# Patient Record
Sex: Female | Born: 1970 | Race: White | Hispanic: No | Marital: Single | Smoking: Never smoker
Health system: Southern US, Community
[De-identification: ages and names within clinical notes are randomized; demographics above are authoritative.]

## PROBLEM LIST (undated history)

## (undated) DIAGNOSIS — G43909 Migraine, unspecified, not intractable, without status migrainosus: Secondary | ICD-10-CM

---

## 1998-09-29 ENCOUNTER — Other Ambulatory Visit: Admission: RE | Admit: 1998-09-29 | Discharge: 1998-09-29 | Payer: Self-pay | Admitting: Gynecology

## 1999-10-02 ENCOUNTER — Other Ambulatory Visit: Admission: RE | Admit: 1999-10-02 | Discharge: 1999-10-02 | Payer: Self-pay | Admitting: Gynecology

## 2000-06-18 ENCOUNTER — Inpatient Hospital Stay (HOSPITAL_COMMUNITY): Admission: AD | Admit: 2000-06-18 | Discharge: 2000-06-21 | Payer: Self-pay | Admitting: Obstetrics & Gynecology

## 2000-08-01 ENCOUNTER — Other Ambulatory Visit: Admission: RE | Admit: 2000-08-01 | Discharge: 2000-08-01 | Payer: Self-pay | Admitting: Obstetrics & Gynecology

## 2001-09-04 ENCOUNTER — Other Ambulatory Visit: Admission: RE | Admit: 2001-09-04 | Discharge: 2001-09-04 | Payer: Self-pay | Admitting: Obstetrics & Gynecology

## 2002-10-05 ENCOUNTER — Other Ambulatory Visit: Admission: RE | Admit: 2002-10-05 | Discharge: 2002-10-05 | Payer: Self-pay | Admitting: Obstetrics & Gynecology

## 2003-10-19 ENCOUNTER — Other Ambulatory Visit: Admission: RE | Admit: 2003-10-19 | Discharge: 2003-10-19 | Payer: Self-pay | Admitting: Obstetrics & Gynecology

## 2003-11-09 ENCOUNTER — Emergency Department (HOSPITAL_COMMUNITY): Admission: AD | Admit: 2003-11-09 | Discharge: 2003-11-09 | Payer: Self-pay | Admitting: Family Medicine

## 2004-11-28 ENCOUNTER — Other Ambulatory Visit: Admission: RE | Admit: 2004-11-28 | Discharge: 2004-11-28 | Payer: Self-pay | Admitting: Obstetrics & Gynecology

## 2005-06-05 ENCOUNTER — Other Ambulatory Visit: Admission: RE | Admit: 2005-06-05 | Discharge: 2005-06-05 | Payer: Self-pay | Admitting: Obstetrics & Gynecology

## 2005-11-13 ENCOUNTER — Other Ambulatory Visit: Admission: RE | Admit: 2005-11-13 | Discharge: 2005-11-13 | Payer: Self-pay | Admitting: Obstetrics & Gynecology

## 2006-02-08 ENCOUNTER — Encounter: Admission: RE | Admit: 2006-02-08 | Discharge: 2006-02-08 | Payer: Self-pay | Admitting: Specialist

## 2012-05-06 ENCOUNTER — Encounter (HOSPITAL_COMMUNITY): Payer: Self-pay | Admitting: *Deleted

## 2012-05-06 ENCOUNTER — Emergency Department (HOSPITAL_COMMUNITY)
Admission: EM | Admit: 2012-05-06 | Discharge: 2012-05-07 | Disposition: A | Discharge status: Home / Self Care | Payer: BC Managed Care – PPO | Attending: Emergency Medicine | Admitting: Emergency Medicine

## 2012-05-06 DIAGNOSIS — B349 Viral infection, unspecified: Secondary | ICD-10-CM

## 2012-05-06 DIAGNOSIS — B9789 Other viral agents as the cause of diseases classified elsewhere: Secondary | ICD-10-CM | POA: Insufficient documentation

## 2012-05-06 DIAGNOSIS — G43909 Migraine, unspecified, not intractable, without status migrainosus: Secondary | ICD-10-CM | POA: Insufficient documentation

## 2012-05-06 DIAGNOSIS — R197 Diarrhea, unspecified: Secondary | ICD-10-CM | POA: Insufficient documentation

## 2012-05-06 DIAGNOSIS — R110 Nausea: Secondary | ICD-10-CM | POA: Insufficient documentation

## 2012-05-06 DIAGNOSIS — I10 Essential (primary) hypertension: Secondary | ICD-10-CM | POA: Insufficient documentation

## 2012-05-06 HISTORY — DX: Migraine, unspecified, not intractable, without status migrainosus: G43.909

## 2012-05-06 NOTE — ED Notes (Addendum)
C/o migraine HA, onset ~ 6 hrs ago, has tried advil migraine 1430, tylenol migraine 1730 & excedrin migraine 2030 w/o relief. Also mentions cough, congestion and sore throat onset Sunday (sore throat resolved). Alert, moaning and sighing, interactive, skin W&D, resps e/u, speaking in clear complete sentences.  (denies: nv, fever). Also mentions diarrhea onset yesterday. Last BM 3 hrs ago. (BM loose, not watery). Also took xanax at 2030. Mentions periods were initial cause of migraines, also reports lots of stress with family issues (son).

## 2012-05-07 MED ORDER — KETOROLAC TROMETHAMINE 30 MG/ML IJ SOLN
30.0000 mg | Freq: Once | INTRAMUSCULAR | Status: AC
  Administered 2012-05-07: 30 mg via INTRAVENOUS
  Filled 2012-05-07: qty 1

## 2012-05-07 MED ORDER — METOCLOPRAMIDE HCL 5 MG/ML IJ SOLN
10.0000 mg | Freq: Once | INTRAMUSCULAR | Status: AC
  Administered 2012-05-07: 10 mg via INTRAVENOUS
  Filled 2012-05-07: qty 2

## 2012-05-07 MED ORDER — DIPHENHYDRAMINE HCL 50 MG/ML IJ SOLN
12.5000 mg | Freq: Once | INTRAMUSCULAR | Status: AC
  Administered 2012-05-07: 12.5 mg via INTRAVENOUS
  Filled 2012-05-07: qty 1

## 2012-05-07 MED ORDER — SODIUM CHLORIDE 0.9 % IV SOLN
Freq: Once | INTRAVENOUS | Status: AC
  Administered 2012-05-07: 02:00:00 via INTRAVENOUS

## 2012-05-07 MED ORDER — DEXAMETHASONE SODIUM PHOSPHATE 10 MG/ML IJ SOLN
10.0000 mg | Freq: Once | INTRAMUSCULAR | Status: AC
  Administered 2012-05-07: 10 mg via INTRAVENOUS
  Filled 2012-05-07: qty 1

## 2012-05-07 NOTE — ED Notes (Signed)
Vitals at 0318 charted under wrong time. Vitals done at 0215.

## 2012-05-07 NOTE — Discharge Instructions (Signed)
Try to eat lightly, easily digested foods for the next several days

## 2012-05-07 NOTE — ED Provider Notes (Signed)
Medical screening examination/treatment/procedure(s) were performed by non-physician practitioner and as supervising physician I was immediately available for consultation/collaboration.    Zianne Schubring D Nadim Malia, MD 05/07/12 0700 

## 2012-05-07 NOTE — ED Provider Notes (Signed)
History     CSN: [id number]  Arrival date & time 05/06/12  2314   None     Chief Complaint  Patient presents with  . Migraine  . Hypertension    (Consider location/radiation/quality/duration/timing/severity/associated sxs/prior treatment) HPI Comments: Patient with history of migraine headaches, usually can take care of at home with over-the-counter medications.  This has been complicated by a viral syndrome, consisting of nausea, and diarrhea that started on Saturday  Patient is a 41 y.o. female presenting with migraine and hypertension. The history is provided by the patient.  Migraine This is a recurrent problem. The current episode started yesterday. The problem occurs constantly. The problem has been gradually worsening. Associated symptoms include headaches and nausea. Pertinent negatives include no fever or vomiting.  Hypertension Associated symptoms include headaches and nausea. Pertinent negatives include no fever or vomiting.    Past Medical History  Diagnosis Date  . Migraines     Past Surgical History  Procedure Date  . Cesarean section     No family history on file.  History  Substance Use Topics  . Smoking status: Never Smoker   . Smokeless tobacco: Not on file  . Alcohol Use: No    OB History    Grav Para Term Preterm Abortions TAB SAB Ect Mult Living                  Review of Systems  Constitutional: Negative for fever.  Gastrointestinal: Positive for nausea and diarrhea. Negative for vomiting.  Neurological: Positive for headaches. Negative for dizziness.    Allergies  Penicillins  Home Medications   Current Outpatient Rx  Name Route Sig Dispense Refill  . ACETAMINOPHEN 500 MG PO TABS Oral Take 1,000 mg by mouth every 6 (six) hours as needed. For headaches    . ALPRAZOLAM 0.5 MG PO TABS Oral Take 0.5 mg by mouth 3 (three) times daily as needed. For sleep    . ASPIRIN-ACETAMINOPHEN-CAFFEINE 250-250-65 MG PO TABS Oral Take 2 tablets by  mouth every 6 (six) hours as needed. For headaches    . IBUPROFEN 200 MG PO CAPS Oral Take 400 mg by mouth 3 (three) times daily as needed. For migraine      BP 182/95  Pulse 83  Temp(Src) 98 F (36.7 C) (Oral)  Resp 20  SpO2 98%  Physical Exam  Constitutional: She is oriented to person, place, and time. She appears well-developed and well-nourished.  HENT:  Head: Normocephalic.  Eyes: Pupils are equal, round, and reactive to light.  Neck: Normal range of motion.  Cardiovascular: Normal rate.   Pulmonary/Chest: Effort normal.  Musculoskeletal: Normal range of motion.  Neurological: She is alert and oriented to person, place, and time. No cranial nerve deficit.  Skin: Skin is warm.    ED Course  Procedures (including critical care time)  Labs Reviewed - No data to display No results found.   1. Migraine headache   2. Viral syndrome     Patient states she is feeling much better.  Her headache pain.  Now is 3/10, and she feels that she comments above.  The rest of her headache  MDM   Will hydrate, and use the abscess.  Migraine.  Cocktail, consisting of Toradol Benadryl, Reglan, and dexamethasone        Arman Filter, NP 05/07/12 [id number]  Arman Filter, NP 05/07/12 1191  Arman Filter, NP 05/07/12 [phone]

## 2013-02-27 ENCOUNTER — Ambulatory Visit: Payer: BC Managed Care – PPO | Admitting: Family Medicine

## 2015-04-28 ENCOUNTER — Other Ambulatory Visit: Payer: Self-pay | Admitting: Obstetrics and Gynecology

## 2015-04-28 DIAGNOSIS — R4702 Dysphasia: Secondary | ICD-10-CM

## 2015-05-03 ENCOUNTER — Ambulatory Visit
Admission: RE | Admit: 2015-05-03 | Discharge: 2015-05-03 | Disposition: A | Discharge status: Home / Self Care | Payer: Self-pay | Source: Ambulatory Visit | Attending: Obstetrics and Gynecology | Admitting: Obstetrics and Gynecology

## 2015-05-03 DIAGNOSIS — R4702 Dysphasia: Secondary | ICD-10-CM

## 2015-05-05 ENCOUNTER — Other Ambulatory Visit: Payer: Self-pay | Admitting: Orthopedic Surgery

## 2015-05-05 DIAGNOSIS — M5416 Radiculopathy, lumbar region: Secondary | ICD-10-CM

## 2015-05-06 ENCOUNTER — Ambulatory Visit
Admission: RE | Admit: 2015-05-06 | Discharge: 2015-05-06 | Disposition: A | Discharge status: Home / Self Care | Payer: BLUE CROSS/BLUE SHIELD | Source: Ambulatory Visit | Attending: Orthopedic Surgery | Admitting: Orthopedic Surgery

## 2015-05-06 DIAGNOSIS — M5416 Radiculopathy, lumbar region: Secondary | ICD-10-CM

## 2016-03-30 ENCOUNTER — Other Ambulatory Visit: Payer: Self-pay | Admitting: Obstetrics & Gynecology

## 2016-03-30 DIAGNOSIS — R928 Other abnormal and inconclusive findings on diagnostic imaging of breast: Secondary | ICD-10-CM

## 2016-04-06 ENCOUNTER — Ambulatory Visit
Admission: RE | Admit: 2016-04-06 | Discharge: 2016-04-06 | Disposition: A | Discharge status: Home / Self Care | Payer: BLUE CROSS/BLUE SHIELD | Source: Ambulatory Visit | Attending: Obstetrics & Gynecology | Admitting: Obstetrics & Gynecology

## 2016-04-06 DIAGNOSIS — R928 Other abnormal and inconclusive findings on diagnostic imaging of breast: Secondary | ICD-10-CM

## 2016-11-01 IMAGING — MR MR LUMBAR SPINE W/O CM
5 series · 48 of 48 positions shown · non-contrast
Comparison: None.

CLINICAL DATA: Low back pain.  Left leg radiculopathy

EXAM:
MRI LUMBAR SPINE WITHOUT CONTRAST
TECHNIQUE: Multiplanar, multisequence MR imaging of the lumbar spine was
performed. No intravenous contrast was administered.

[Series 3: T2 · sagittal · 4.0mm · 0.88mm/px · 5 of 13 slices shown (1 of 2)]
[im 1/13]
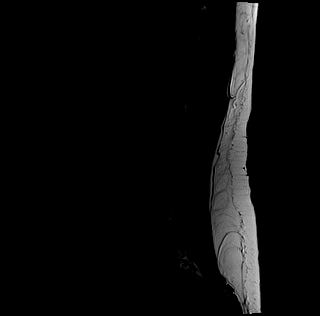
[im 4/13]
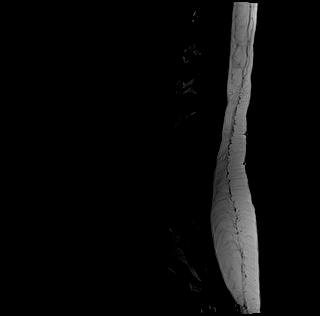
[im 7/13]
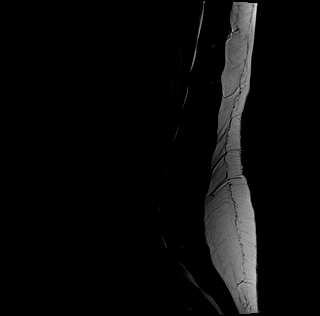
[im 10/13]
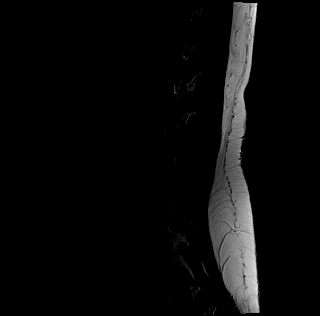
[im 13/13]
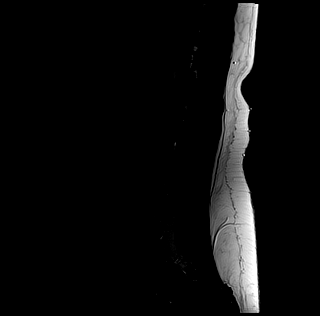

[Series 4: tirm sag · sagittal · 4.0mm · 0.55mm/px · 5 of 13 slices shown]
[im 1/13]
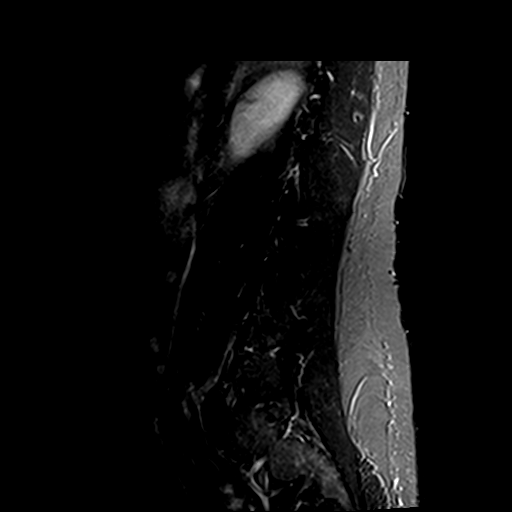
[im 4/13]
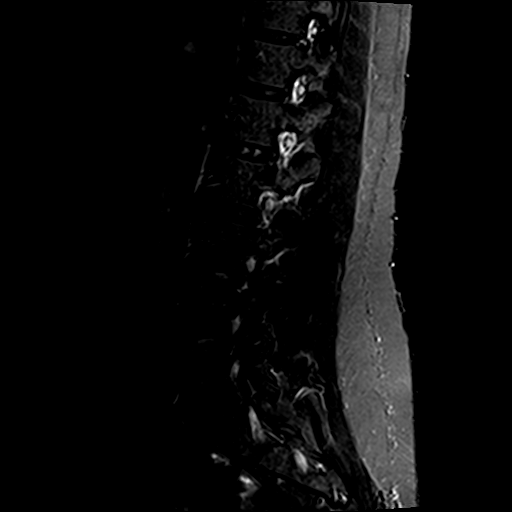
[im 7/13]
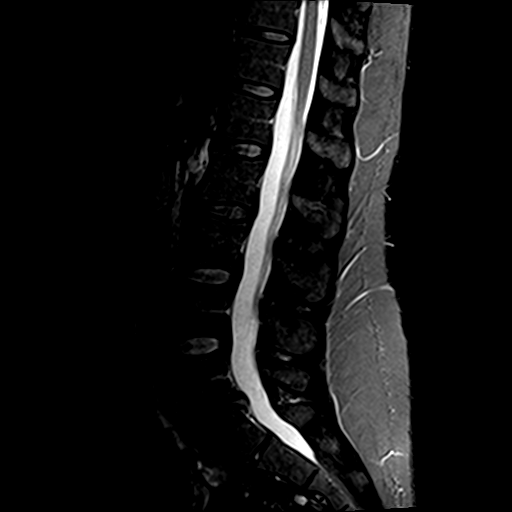
[im 10/13]
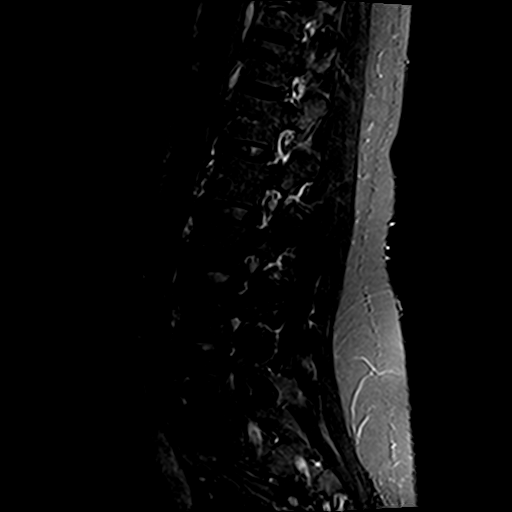
[im 13/13]
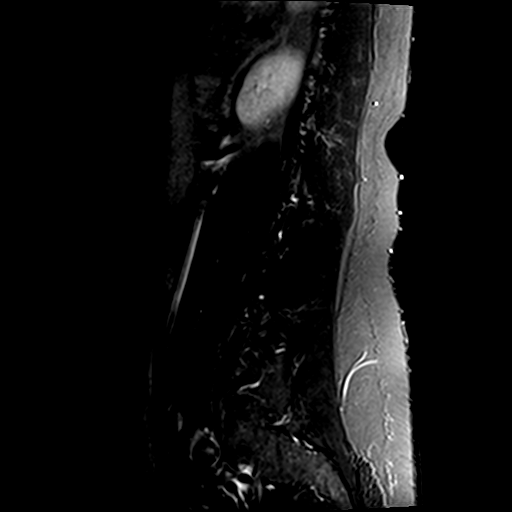

[Series 5: T1 · sagittal · 4.0mm · 0.88mm/px · 6 of 13 slices shown (1 of 2)]
[im 1/13]
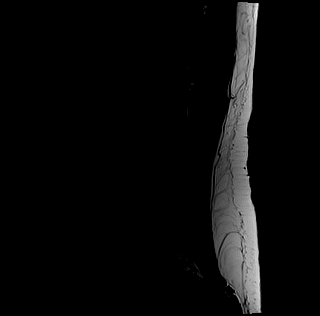
[im 3/13]
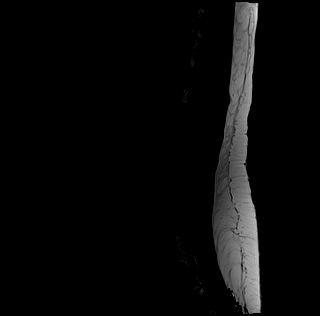
[im 5/13]
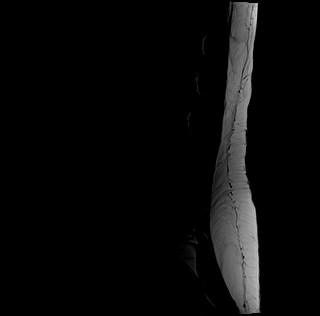
[im 8/13]
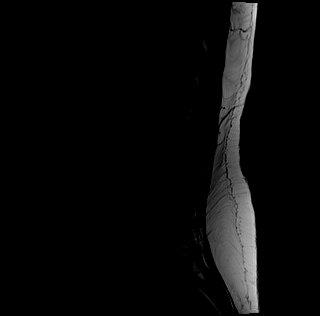
[im 10/13]
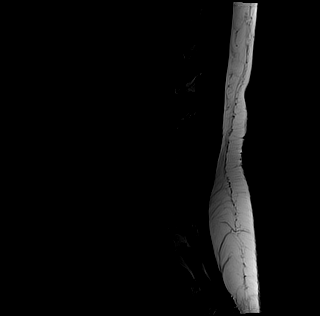
[im 13/13]
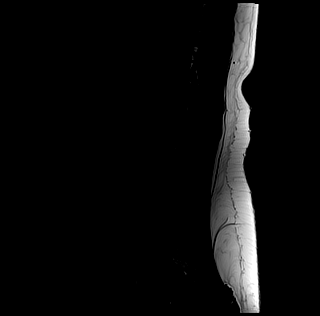

[Series 6: T1 · axial · 4.0mm · 0.70mm/px · z∈[-56,+133]mm · 16 of 36 slices shown (2 of 2)]
[im 1/36]
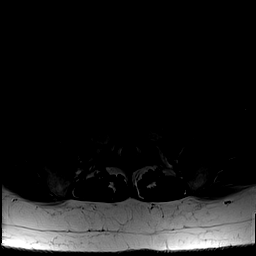
[im 3/36]
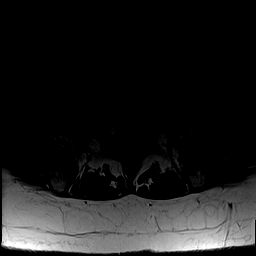
[im 5/36]
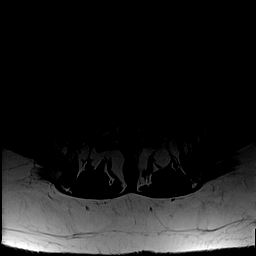
[im 8/36]
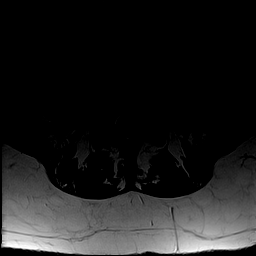
[im 10/36]
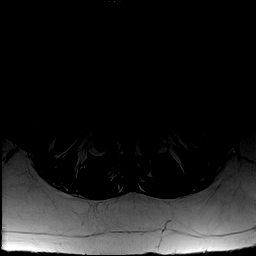
[im 12/36]
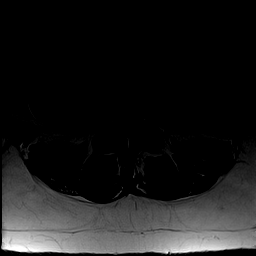
[im 15/36]
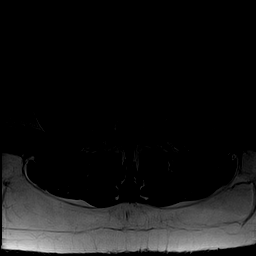
[im 17/36]
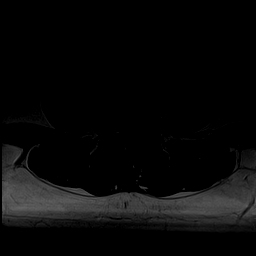
[im 19/36]
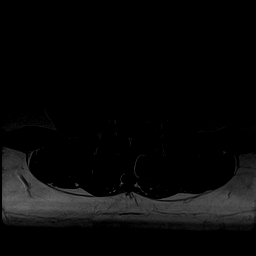
[im 22/36]
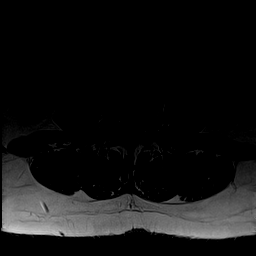
[im 24/36]
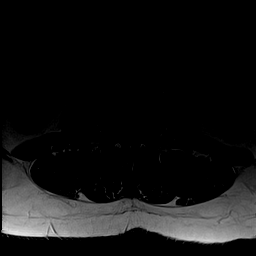
[im 26/36]
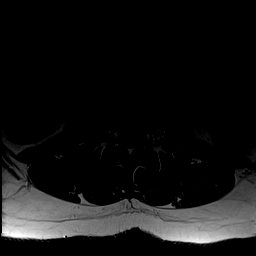
[im 29/36]
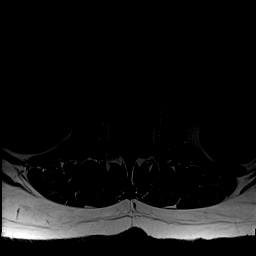
[im 31/36]
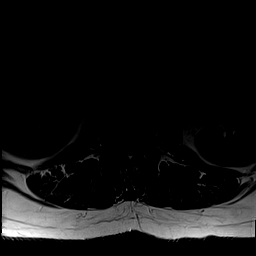
[im 33/36]
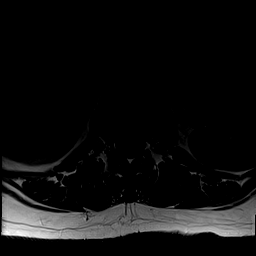
[im 36/36]
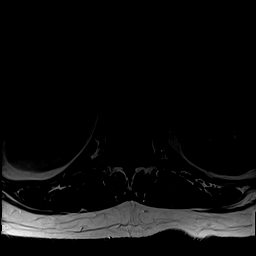

[Series 7: T2 · axial · 4.0mm · 0.70mm/px · z∈[-56,+133]mm · 16 of 36 slices shown (2 of 2)]
[im 1/36]
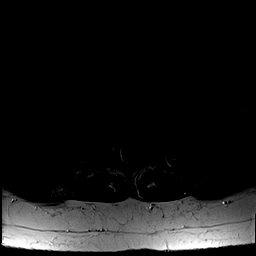
[im 3/36]
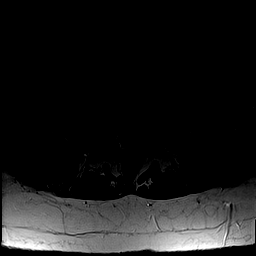
[im 5/36]
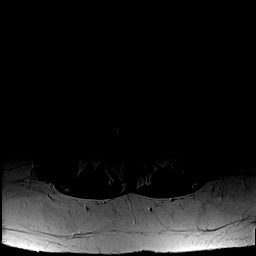
[im 8/36]
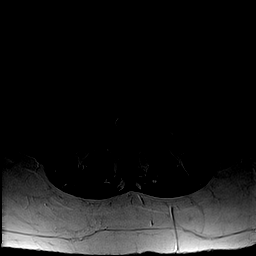
[im 10/36]
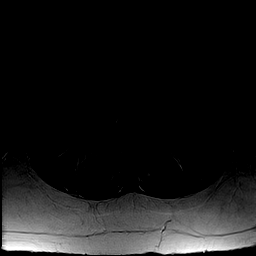
[im 12/36]
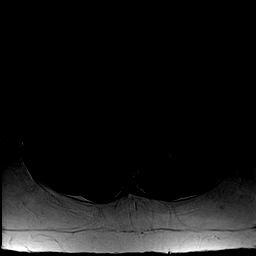
[im 15/36]
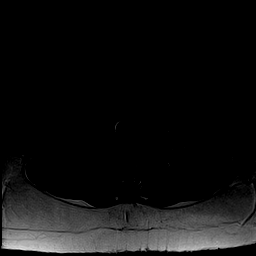
[im 17/36]
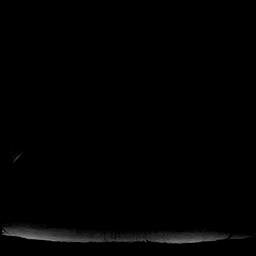
[im 19/36]
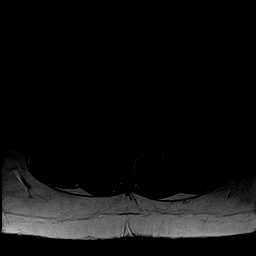
[im 22/36]
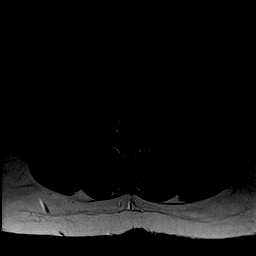
[im 24/36]
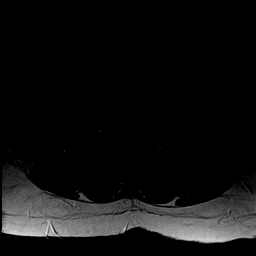
[im 26/36]
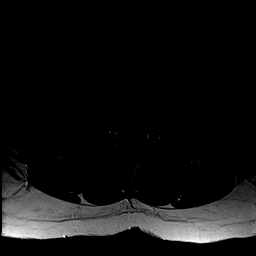
[im 29/36]
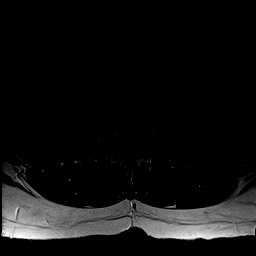
[im 31/36]
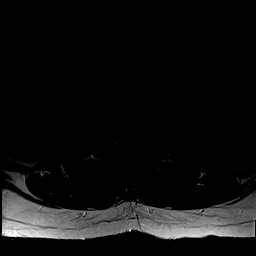
[im 33/36]
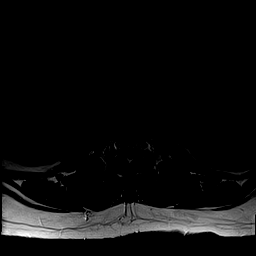
[im 36/36]
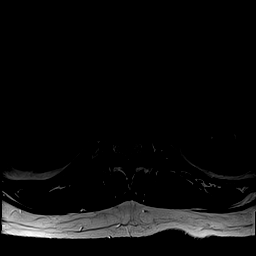

[48 of 48 positions shown; findings below may reference images not displayed]

FINDINGS: Normal lumbar alignment. Negative for fracture or mass. No bone
marrow edema. Conus medullaris normal and terminates at L1-2.

L1-2:  Negative

L2-3:  Negative

L3-4:  Negative

L4-5:  Early disc and facet degeneration without spinal stenosis

L5-S1: Moderately large left-sided disc protrusion with impingement
left S1 nerve root in the subarticular zone. Left L5 nerve root
exits freely. Mild flattening of the thecal sac on the left.
IMPRESSION: Moderately large left-sided disc protrusion L5-S1 with impingement
of the left S1 nerve root.

## 2021-08-28 ENCOUNTER — Telehealth: Payer: Self-pay | Admitting: Physical Medicine and Rehabilitation

## 2021-08-28 NOTE — Telephone Encounter (Signed)
Received call from patient. She inquiring on when she last treated with Dr. Alvester Morin. I reviewed SRS and advised patient last [facility] 05/26/2015. She states she has felt great up until recently. May call back to try and schedule.

## 2023-12-25 ENCOUNTER — Telehealth: Payer: Self-pay | Admitting: Physical Medicine and Rehabilitation

## 2023-12-25 NOTE — Telephone Encounter (Signed)
 Received call from patient inquiring about ESI many years ago. I pulled up SRS and advised pt she treated with Dr. Daisey Dryer 05/26/2015. She would like copy of that note. I emailed her release form to complete to [email or web address] she will complete and sign and return to me to process.

## 2024-10-12 ENCOUNTER — Encounter: Payer: Self-pay | Admitting: Radiology
